# Patient Record
Sex: Female | Born: 1970 | Race: White | Hispanic: No | Marital: Single | Smoking: Current every day smoker
Health system: Southern US, Community
[De-identification: ages and names within clinical notes are randomized; demographics above are authoritative.]

## PROBLEM LIST (undated history)

## (undated) DIAGNOSIS — M549 Dorsalgia, unspecified: Secondary | ICD-10-CM

## (undated) DIAGNOSIS — O09529 Supervision of elderly multigravida, unspecified trimester: Secondary | ICD-10-CM

## (undated) HISTORY — PX: OTHER SURGICAL HISTORY: SHX169

## (undated) HISTORY — DX: Supervision of elderly multigravida, unspecified trimester: O09.529

---

## 2001-11-16 ENCOUNTER — Inpatient Hospital Stay (HOSPITAL_COMMUNITY): Admission: AD | Admit: 2001-11-16 | Discharge: 2001-11-18 | Payer: Self-pay | Admitting: Family Medicine

## 2001-11-16 ENCOUNTER — Encounter: Payer: Self-pay | Admitting: Family Medicine

## 2002-01-01 ENCOUNTER — Encounter: Payer: Self-pay | Admitting: Obstetrics and Gynecology

## 2002-01-01 ENCOUNTER — Ambulatory Visit (HOSPITAL_COMMUNITY): Admission: RE | Admit: 2002-01-01 | Discharge: 2002-01-01 | Payer: Self-pay | Admitting: Obstetrics and Gynecology

## 2002-06-21 ENCOUNTER — Inpatient Hospital Stay (HOSPITAL_COMMUNITY): Admission: AD | Admit: 2002-06-21 | Discharge: 2002-06-23 | Payer: Self-pay | Admitting: Obstetrics and Gynecology

## 2004-07-07 ENCOUNTER — Inpatient Hospital Stay (HOSPITAL_COMMUNITY): Admission: AD | Admit: 2004-07-07 | Discharge: 2004-07-10 | Payer: Self-pay | Admitting: Obstetrics and Gynecology

## 2006-06-13 ENCOUNTER — Emergency Department (HOSPITAL_COMMUNITY): Admission: EM | Admit: 2006-06-13 | Discharge: 2006-06-13 | Payer: Self-pay | Admitting: Emergency Medicine

## 2007-04-28 ENCOUNTER — Emergency Department (HOSPITAL_COMMUNITY): Admission: EM | Admit: 2007-04-28 | Discharge: 2007-04-28 | Payer: Self-pay | Admitting: Emergency Medicine

## 2009-10-29 ENCOUNTER — Emergency Department (HOSPITAL_COMMUNITY): Admission: EM | Admit: 2009-10-29 | Discharge: 2009-10-29 | Payer: Self-pay | Admitting: Emergency Medicine

## 2010-01-28 ENCOUNTER — Ambulatory Visit (HOSPITAL_COMMUNITY): Admission: RE | Admit: 2010-01-28 | Discharge: 2010-01-28 | Payer: Self-pay | Admitting: Obstetrics & Gynecology

## 2010-09-12 ENCOUNTER — Emergency Department (HOSPITAL_COMMUNITY)
Admission: EM | Admit: 2010-09-12 | Discharge: 2010-09-12 | Payer: Self-pay | Source: Home / Self Care | Admitting: Emergency Medicine

## 2010-11-09 LAB — CBC
HCT: 30.4 % — ABNORMAL LOW (ref 36.0–46.0)
Hemoglobin: 10.1 g/dL — ABNORMAL LOW (ref 12.0–15.0)
MCHC: 33 g/dL (ref 30.0–36.0)
MCV: 85.3 fL (ref 78.0–100.0)
RBC: 3.56 MIL/uL — ABNORMAL LOW (ref 3.87–5.11)
RDW: 16.3 % — ABNORMAL HIGH (ref 11.5–15.5)

## 2010-11-09 LAB — TYPE AND SCREEN: ABO/RH(D): A POS

## 2010-11-09 LAB — COMPREHENSIVE METABOLIC PANEL
ALT: 9 U/L (ref 0–35)
Alkaline Phosphatase: 40 U/L (ref 39–117)
BUN: 11 mg/dL (ref 6–23)
CO2: 25 mEq/L (ref 19–32)
Calcium: 9 mg/dL (ref 8.4–10.5)
GFR calc non Af Amer: >60 mL/min (ref >60)
Glucose, Bld: 73 mg/dL (ref 70–99)
Sodium: 137 mEq/L (ref 135–145)
Total Protein: 6.2 g/dL (ref 6.0–8.3)

## 2010-11-09 LAB — HCG, QUANTITATIVE, PREGNANCY: hCG, Beta Chain, Quant, S: <2 m[IU]/mL (ref <5)

## 2010-11-09 LAB — MRSA CULTURE

## 2011-01-08 NOTE — H&P (Signed)
[facility]  Patient:    Melissa [name], Melissa [name] Visit Number: [id number] MRN: [id number]          Service Type: OBS Location: 4A A425 01 Attending Physician:  Lilyan Punt Dictated by:   Lilyan Punt, M.D. Admit Date:  11/16/2001                           History and Physical  CHIEF COMPLAINT:  Nausea, vomiting, dizziness.  HISTORY OF PRESENT ILLNESS:  This is a 40 year old white female who presents to the office on November 16, 2001, with intense nausea along with multiple episodes of vomiting, unable to keep anything down.  Denies any diarrhea, denies any fever.  Relates slight chills.  Denies dysuria or urinary frequency.  Denies head congestion, sore throat, shortness of breath.  Does relate dizziness when she stands up, and feels very weak.  PAST MEDICAL HISTORY:  The patient has one child who is 68 years old.  She has never been hospitalized, other than childbirth.  Does not have any chronic health problems.  SOCIAL HISTORY:  Smokes one-half pack per day.  Drinks occasionally.  ALLERGIES:  BEES.  MEDICATIONS:  None.  FAMILY HISTORY:  Heart disease.  REVIEW OF SYSTEMS:  Has been seen in the past for migraines and has been treated with Wigraine in the past and also Vicodin.  She had been on Celexa and verapamil, but it is uncertain if she is taking that currently.  PHYSICAL EXAMINATION:  GENERAL:  Looks to feel ill but is alert, does not appear toxic.  HEENT:  TMs normal.  Throat normal.  MM moist.  Lips are dry.  NECK:  Supple.  No masses.  CHEST:  CTA.  HEART:  Rate 90, regular.  No murmurs.  ABDOMEN:  Soft.  No masses.  No tenderness.  EXTREMITIES:  No edema.  SKIN:  Turgor good.  VITAL SIGNS:  Blood pressure lying down 100/70, heart rate 90; sitting 96/72, heart rate 98; standing 80/60, heart rate 110.  LABORATORY DATA:  UA 3+ ketones.  Urine pregnancy positive.  ASSESSMENT AND PLAN: 1. Hyperemesis gravidarum versus  gastroenteritis:  Will admit into the    hospital.  Give IV fluids, and give small amounts of Phenergan as needed    for nausea.  Will consult Dr. Turner Daniels to see the patient. 2. Mild dehydration:  IV fluids as per above.  Monitor laboratories. 3. History of migraines:  Not an issue currently. Dictated by:   Lilyan Punt, M.D. Attending Physician:  Lilyan Punt DD:  11/17/01 TD:  11/17/01 Job: [id number]

## 2011-01-08 NOTE — H&P (Signed)
   NAME:  Melissa, [name] NO.:  [id number]   MEDICAL RECORD NO.:  [id number]                  PATIENT TYPE:   LOCATION:                                       FACILITY:   PHYSICIAN:  Jacklyn Shell, CNM       DATE OF BIRTH:   DATE OF ADMISSION:  06/21/2002  DATE OF DISCHARGE:                                HISTORY & PHYSICAL   CHIEF COMPLAINT:  Induction of labor due to cervical favorability and  history of rapid labors.   HISTORY OF PRESENT ILLNESS:  The patient is a 40 year old gravida 2, para 1  with an EDC of June 25, 2002 based on a last menstrual period.  First and  second trimester ultrasounds place her EDC anywhere between October 27 and  October 28, placing her at approximately 39-1/2 weeks to [redacted] weeks pregnant.  She has had regular prenatal care with a total weight gain of 35 pounds with  appropriate fundal height growth.  Blood pressures are 80s-110s/50s-80s.   PRENATAL LABORATORY DATA:  Blood type A+, rubella immune.  HBsAg, HIV, RPR,  gonorrhea/Chlamydia, MSAFP, and GBS are all negative.  One-hour GTT was  normal at 105.  She did have a first trimester positive drug screen for  marijuana with a negative one at 27 weeks.   PHYSICAL EXAMINATION:  HEENT:  Bad dentition.  HEART:  Regular rate and rhythm.  LUNGS:  Clear.  ABDOMEN:  Soft, nontender with a 37-cm fundal height.  PELVIC:  Cervix is 3 cm, 50% effaced, at 0 station.  Legs are negative.   IMPRESSION:  Intrauterine pregnancy at 39-1/2 to [redacted] weeks pregnant.  Induction of labor due to cervical favorability, pelvic pressure, and  history of rapid labor.   PLAN:  The patient to be admitted on October 30 at 7 a.m.  Plan artifical  rupture of membranes with probable Pitocin.                                               Jacklyn Shell, CNM    FC/MEDQ  D:  06/19/2002  T:  06/20/2002  Job:  [id number]   cc:   [facility]

## 2011-01-08 NOTE — Op Note (Signed)
NAME:  ARNEDA, SAPPINGTON NO.:  [id number]   MEDICAL RECORD NO.:  [id number]          PATIENT TYPE:  INP   LOCATION:  LDR2                          FACILITY:  [facility]   PHYSICIAN:  Tilda Burrow, M.D. DATE OF BIRTH:  04-30-1971   DATE OF PROCEDURE:  DATE OF DISCHARGE:                                 OPERATIVE REPORT   DELIVERY SUMMARY:  Onset of labor July 08, 2004.  Date of delivery,  July 08, 2004.  Time of onset of labor is 9 o'clock.  Length of time of  delivery is 10:22.  Length of first-stage labor is 1 hour and 22 minutes.  The length of second-stage labor is 0 minutes.  Third stage of labor, 8  minutes.   DELIVERY NOTE:  Nidia had a controlled, precipitous delivery via nursing  staff over an intact perineum.  Upon delivery, the infant was thoroughly  suctioned and dried.  Apgars were 9 and 9.  On arrival, perineum was noted  to have a very-small first-degree perineal laceration that required no  stitched.  Good hemostasis was noted.  Third stage of labor was actively  managed with 20 units of Pitocin and 1000 cc of lactated Ringer's at a rapid  rate.  The placenta was delivered spontaneously, very shortly __________.  Estimated blood loss as approximately 300 cc.  There was a three-vessel cord  noted upon inspection.  Mother and infant were stabilized in good condition  and transferred out to the postpartum unit.     Darl   DL/MEDQ  D:  16/05/9603  T:  07/08/2004  Job:  [id number]   cc:   [facility]

## 2011-01-08 NOTE — H&P (Signed)
NAME:  Melissa [name], Melissa [name] NO.:  [id number]   MEDICAL RECORD NO.:  [id number]          PATIENT TYPE:  INP   LOCATION:  LDR2                          FACILITY:  [facility]   PHYSICIAN:  Tilda Burrow, M.D. DATE OF BIRTH:  29-Jun-1971   DATE OF ADMISSION:  07/07/2004  DATE OF DISCHARGE:  LH                                HISTORY & PHYSICAL   ADMITTING DIAGNOSIS:  Pregnancy at 39.5 weeks for induction of labor for  post dates.   HISTORY OF PRESENT ILLNESS:  Melissa [name] is a 40 year old, gravida 3, para 2,  due on June 27, 2004, which is consensual with an early ultrasound.   MEDICAL HISTORY:  1.  Tendinitis.  2.  Headaches.   SURGICAL HISTORY:  Negative.   ALLERGIES:  No known allergies.   SOCIAL HISTORY:  She is single.  She lives with the father of the baby, and  he is present and supportive.   PHYSICAL EXAMINATION:  VITAL SIGNS:  Weight is 136, blood pressure 194/68.  ABDOMEN:  Fundal height was 36 cm.  PELVIC:  Cervix was 1 cm, 50%, soft, mid position.   PLAN:  We are going to admit for balloon dilation and pitocin induction of  labor, and we are going to treat her for positive GBS status with ampicillin  2 gm IV and 1 gm q.4h.     Darl   DL/MEDQ  D:  40/98/1191  T:  07/07/2004  Job:  [id number]   cc:   [facility]

## 2011-01-08 NOTE — Discharge Summary (Signed)
[facility]  Patient:    Melissa [name], Melissa [name] Visit Number: [id number] MRN: [id number]          Service Type: OBS Location: 4A A425 01 Attending Physician:  Lazaro Arms Dictated by:   Duane Lope, M.D. Admit Date:  11/16/2001 Discharge Date: 11/18/2001                             Discharge Summary  DISCHARGE DIAGNOSES: 1. Intrauterine pregnancy at nine weeks gestation. 2. Hyperemesis gravidarum with electrolyte disturbance and dehydration.  PROCEDURES:  November 16, 2001, hospital consult.  November 17, 2001, daily care. November 18, 2001, discharge.  HISTORY OF PRESENT ILLNESS:  Please refer to the admission information provided by Dr. Lilyan Punt and also my consult note which was done shortly after the patient was admitted.  HOSPITAL COURSE:  The patient was placed in the hospital.  Underwent IV hydration and nausea control.  She responded quickly.  Was taking clear liquids by the second hospital day and progressed to full liquids and a low-residue, low-fiber diet.  By hospital day #3 she had had no nausea or vomiting, was voiding well, was ambulating without difficulty, and was ready for discharge.  She was discharged to home on Phenergan tablets, and to follow up in our office next week for her first prenatal visit. Dictated by:   Duane Lope, M.D. Attending Physician:  Lazaro Arms DD:  11/18/01 TD:  11/18/01 Job: [id number]

## 2011-01-08 NOTE — Op Note (Signed)
   NAME:  Melissa [name], Melissa [name]                     ACCOUNT NO.:  [id number]   MEDICAL RECORD NO.:  [id number]                   PATIENT TYPE:  INP   LOCATION:  A419                                 FACILITY:  [facility]   PHYSICIAN:  Tilda Burrow, M.D.              DATE OF BIRTH:  11-13-1970   DATE OF PROCEDURE:  DATE OF DISCHARGE:                                 OPERATIVE REPORT   DELIVERY NOTE:  The patient was noted to be 9-10 cm dilated, 0 station at  approximately 1335, with a strong urge to push.  She was allowed to push and  the cervix was easily reduced.  After about a 20-minute, second stage, the  patient delivered a viable female infant at 1400.  The mouth and nose were  suctioned on the perineum; and the body was delivered without difficulty.  Apgars are 8 and 9.  Weight 7 pounds 13 ounces.  Twenty units of Pitocin  diluted in 1000 cc of lactated Ringers was then infused rapidly IV.   The placenta separated spontaneously and was delivered by a controlled cord  traction and maternal pushing effort at 1405.  It was inspected and appears  to be intact with a 3-vessel cord.  The vagina was inspected and a small  first-degree perineal laceration was noted.  After infiltrating it with 5 cc  of 1% Xylocaine it was repaired with 1 stitch of 2-0 Vicryl.  Estimated  blood loss 300 cc.     Jacklyn Shell, CNM             Tilda Burrow, M.D.    FC/MEDQ  D:  06/21/2002  T:  06/21/2002  Job:  [id number]   cc:   [facility]

## 2011-01-08 NOTE — Consult Note (Signed)
[facility]  Patient:    Melissa [name], Melissa [name] Visit Number: [id number] MRN: [id number]          Service Type: OBS Location: 4A A425 01 Attending Physician:  Lilyan Punt Dictated by:   Duane Lope, M.D. Proc. Date: 11/16/01 Admit Date:  11/16/2001                            Consultation Report  BRIEF HISTORY:  The patient is a 40 year old white female gravida 2, para 1, abortus 0, with a estimated gestational age by last menstrual period of approximately 9 weeks who was seen by Dr. Gerda Diss in the office today complaining of excessive nausea and vomiting, large ketones, and orthostatic changes.  The patient had a sonogram at the time of admission that reveals a 9 week 1 day viable embryo with positive cardiac activity with a rate of 165. The patient states she does not have excessive saliva production.  She does have heartburn and basically has been getting sick every day for about 1 month.  She is rather small chronically but denies a history of eating disorder.  PAST MEDICAL HISTORY:  Migraines.  PAST SURGICAL HISTORY:  Negative.  PAST OB HISTORY:  1 vaginal deliver 9 years ago.  ALLERGIES:  None.  MEDICATIONS:  None.  REVIEW OF SYSTEMS:  As per the HPI otherwise negative.  SOCIAL HISTORY:  She smokes 1/2 a pack to 1 pack of cigarettes a day.  No drugs, no alcohol.  PHYSICAL EXAMINATION:  ABDOMEN:  On targeted examination abdomen is benign.  No hepatosplenomegaly or masses, and is nontender.  EXTREMITIES:  Show poor skin turgor and no edema.  IMPRESSION: 1. Intrauterine pregnancy at 9 weeks and 1 day gestation. 2. Hyperemesis gravidarum. 3. Dehydration with hyponatremia on Met-7.  PLAN:  The patient is admitted for IV hydration, nausea control and will be transferred to our service for daily care starting November 17, 2001. Dictated by:   Duane Lope, M.D. Attending Physician:  Lilyan Punt DD:  11/16/01 TD:  11/17/01 Job:  [id number]
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
## 2011-08-17 ENCOUNTER — Encounter: Payer: Self-pay | Admitting: *Deleted

## 2011-08-17 ENCOUNTER — Emergency Department (HOSPITAL_COMMUNITY)
Admission: EM | Admit: 2011-08-17 | Discharge: 2011-08-17 | Disposition: A | Discharge status: Home / Self Care | Payer: Self-pay | Attending: Emergency Medicine | Admitting: Emergency Medicine

## 2011-08-17 DIAGNOSIS — G43909 Migraine, unspecified, not intractable, without status migrainosus: Secondary | ICD-10-CM | POA: Insufficient documentation

## 2011-08-17 DIAGNOSIS — R404 Transient alteration of awareness: Secondary | ICD-10-CM | POA: Insufficient documentation

## 2011-08-17 DIAGNOSIS — H53149 Visual discomfort, unspecified: Secondary | ICD-10-CM | POA: Insufficient documentation

## 2011-08-17 DIAGNOSIS — R110 Nausea: Secondary | ICD-10-CM | POA: Insufficient documentation

## 2011-08-17 DIAGNOSIS — R5381 Other malaise: Secondary | ICD-10-CM | POA: Insufficient documentation

## 2011-08-17 DIAGNOSIS — R5383 Other fatigue: Secondary | ICD-10-CM | POA: Insufficient documentation

## 2011-08-17 MED ORDER — METOCLOPRAMIDE HCL 5 MG/ML IJ SOLN
10.0000 mg | Freq: Once | INTRAMUSCULAR | Status: AC
  Administered 2011-08-17: 10 mg via INTRAMUSCULAR
  Filled 2011-08-17 (×2): qty 2

## 2011-08-17 MED ORDER — KETOROLAC TROMETHAMINE 60 MG/2ML IM SOLN
30.0000 mg | Freq: Once | INTRAMUSCULAR | Status: AC
  Administered 2011-08-17: 30 mg via INTRAMUSCULAR
  Filled 2011-08-17: qty 2

## 2011-08-17 MED ORDER — DIPHENHYDRAMINE HCL 25 MG PO CAPS
25.0000 mg | ORAL_CAPSULE | Freq: Once | ORAL | Status: AC
  Administered 2011-08-17: 25 mg via ORAL
  Filled 2011-08-17 (×2): qty 1

## 2011-08-17 NOTE — ED Notes (Signed)
Pt's speech is slurred and her pupil are pinpoint.

## 2011-08-17 NOTE — ED Notes (Signed)
Pt asleep upon RN entering the room.

## 2011-08-17 NOTE — Discharge Instructions (Signed)
Migraine Headache A migraine headache is an intense, throbbing pain on one or both sides of your head. The exact cause of a migraine headache is not always known. A migraine may be caused when nerves in the brain become irritated and release chemicals that cause swelling within blood vessels, causing pain. Many migraine sufferers have a family history of migraines. Before you get a migraine you may or may not get an aura. An aura is a group of symptoms that can predict the beginning of a migraine. An aura may include:  Visual changes such as:   Flashing lights.   Bright spots or zig-zag lines.   Tunnel vision.   Feelings of numbness.   Trouble talking.   Muscle weakness.  SYMPTOMS  Pain on one or both sides of your head.   Pain that is pulsating or throbbing in nature.   Pain that is severe enough to prevent daily activities.   Pain that is aggravated by any daily physical activity.   Nausea (feeling sick to your stomach), vomiting, or both.   Pain with exposure to bright lights, loud noises, or activity.   General sensitivity to bright lights or loud noises.  MIGRAINE TRIGGERS Examples of triggers of migraine headaches include:   Alcohol.   Smoking.   Stress.   It may be related to menses (female menstruation).   Aged cheeses.   Foods or drinks that contain nitrates, glutamate, aspartame, or tyramine.   Lack of sleep.   Chocolate.   Caffeine.   Hunger.   Medications such as nitroglycerine (used to treat chest pain), birth control pills, estrogen, and some blood pressure medications.  DIAGNOSIS  A migraine headache is often diagnosed based on:  Symptoms.   Physical examination.   A computerized X-ray scan (computed tomography, CT) of your head.  TREATMENT  Medications can help prevent migraines if they are recurrent or should they become recurrent. Your caregiver can help you with a medication or treatment program that will be helpful to you.   Lying  down in a dark, quiet room may be helpful.   Keeping a headache diary may help you find a trend as to what may be triggering your headaches.  SEEK IMMEDIATE MEDICAL CARE IF:   You have confusion, personality changes or seizures.   You have headaches that wake you from sleep.   You have an increased frequency in your headaches.   You have a stiff neck.   You have a loss of vision.   You have muscle weakness.   You start losing your balance or have trouble walking.   You feel faint or pass out.  MAKE SURE YOU:   Understand these instructions.   Will watch your condition.   Will get help right away if you are not doing well or get worse.  Document Released: 08/09/2005 Document Revised: 04/21/2011 Document Reviewed: 03/25/2009 [employer] Patient Information 2012 [employer]. 

## 2011-08-17 NOTE — ED Notes (Signed)
Pt states migraine x 3 days. Denies vomiting today. States she took a vicodin earlier for pain. Pt states migraine is different this time due to blurred vision lasting 30 sec at a time and ringing in the ears. NAD at this time.

## 2011-08-17 NOTE — ED Provider Notes (Signed)
History     CSN: [id number]  Arrival date & time 08/17/11  1431   First MD Initiated Contact with Patient 08/17/11 1532      Chief Complaint  Patient presents with  . Migraine    (Consider location/radiation/quality/duration/timing/severity/associated sxs/prior treatment) HPI Comments: Pt reports similar HA to prior migraines, but has been present for 3 days.  Pt is asleep when I come into room. She seems slow speech and drowsy, she attributes to being tired, and didn't sleep well last night due to HA.  She took someone else's Flexeril 2 days ago and has not had any more, didn't help her HA.  She has taken OTC meds without relief.  She reports normally she can sleep and feels improved.  No trauma, no fever.  She has nausea and photophobia like typical migraines.  Was gradual in onset.  No facial weakness, numbness or weakness in arms or legs.    The history is provided by the patient.    Past Medical History  Diagnosis Date  . Migraine     Past Surgical History  Procedure Date  . Abdominal lesion     No family history on file.  History  Substance Use Topics  . Smoking status: Current Everyday Smoker  . Smokeless tobacco: Not on file  . Alcohol Use: Yes     OCc    OB History    Grav Para Term Preterm Abortions TAB SAB Ect Mult Living                  Review of Systems  Constitutional: Negative for fever, chills and appetite change.  HENT: Negative for rhinorrhea, neck pain, neck stiffness, postnasal drip and sinus pressure.   Eyes: Positive for photophobia. Negative for visual disturbance.  Respiratory: Negative for shortness of breath.   Gastrointestinal: Positive for nausea. Negative for vomiting.  Skin: Negative for rash.  Neurological: Positive for headaches. Negative for syncope and weakness.    Allergies  Bee  Home Medications  No current outpatient prescriptions on file.  BP 126/76  Pulse 86  Temp(Src) 97.7 F (36.5 C) (Oral)  Resp 20  Ht 5'  (1.524 m)  Wt 100 lb (45.36 kg)  BMI 19.53 kg/m2  SpO2 100%  LMP 08/02/2011  Physical Exam  Nursing note and vitals reviewed. Constitutional: She is oriented to person, place, and time. She appears well-developed and well-nourished. She appears listless.  HENT:  Head: Normocephalic and atraumatic.  Eyes: Conjunctivae and EOM are normal. Pupils are equal, round, and reactive to light. No scleral icterus.  Neck: Normal range of motion. Neck supple.  Pulmonary/Chest: Effort normal. She has no wheezes.  Abdominal: Soft.  Neurological: She is oriented to person, place, and time. She has normal strength. She appears listless. No cranial nerve deficit. Coordination normal. GCS eye subscore is 4. GCS verbal subscore is 5. GCS motor subscore is 6.  Skin: Skin is warm and dry.  Psychiatric: She is slowed.    ED Course  Procedures (including critical care time)  Labs Reviewed - No data to display No results found.   1. Migraine       MDM  Pt is somewhat drowsy already.  She denies any other medications other than OTC and the flexeril 2 days ago.  otherwise she is purposeful and is oriented times 3.  Will give IM toradol, reglan and oral benadryl and will reassess.  I think pt can then rest at home and would be safe for outpt  therapy.  Despite somewhat drowsiness I do not think this is altered mental status requiring head CT or MRI as she is not focally weak or numb, not confused, just merely drowsy and sleepy.        5:45 PM Pt rechecked.  Pt is sleeping, but rouses easily.  Somnolent, but sensical, reports HA improved, wants to go home and will continue sleeping in her own bed.  Purposeful, vital stable.    Gavin Pound. Oletta Lamas, MD 08/17/11 1745

## 2011-08-24 NOTE — L&D Delivery Note (Signed)
Delivery Note At 5:28 PM a viable female was delivered via Vaginal, Spontaneous Delivery (Presentation:LOA ;  ).  APGAR: 9, 9; weight  Pending,. Placenta status: Intact, Spontaneous.  Cord: 3 vessels with the following complications: None.   Anesthesia:  none Episiotomy: none Lacerations: none Suture Repair: none Est. Blood Loss (mL): 200   Mom to postpartum.  Baby to nursery-stable.  CRESENZO-DISHMAN,Pari Lombard 04/24/2012, 5:40 PM

## 2011-12-30 ENCOUNTER — Other Ambulatory Visit: Payer: Self-pay | Admitting: Adult Health

## 2011-12-30 ENCOUNTER — Other Ambulatory Visit (HOSPITAL_COMMUNITY)
Admission: RE | Admit: 2011-12-30 | Discharge: 2011-12-30 | Disposition: A | Discharge status: Home / Self Care | Payer: Medicaid Other | Source: Ambulatory Visit | Attending: Obstetrics and Gynecology | Admitting: Obstetrics and Gynecology

## 2011-12-30 DIAGNOSIS — Z113 Encounter for screening for infections with a predominantly sexual mode of transmission: Secondary | ICD-10-CM | POA: Insufficient documentation

## 2011-12-30 DIAGNOSIS — Z01419 Encounter for gynecological examination (general) (routine) without abnormal findings: Secondary | ICD-10-CM | POA: Insufficient documentation

## 2011-12-30 DIAGNOSIS — R87810 Cervical high risk human papillomavirus (HPV) DNA test positive: Secondary | ICD-10-CM | POA: Insufficient documentation

## 2011-12-30 LAB — OB RESULTS CONSOLE GC/CHLAMYDIA
Chlamydia: NEGATIVE
Gonorrhea: NEGATIVE

## 2011-12-30 LAB — OB RESULTS CONSOLE HIV ANTIBODY (ROUTINE TESTING): HIV: NONREACTIVE

## 2011-12-30 LAB — OB RESULTS CONSOLE ABO/RH

## 2012-04-10 LAB — OB RESULTS CONSOLE GBS: GBS: POSITIVE

## 2012-04-20 ENCOUNTER — Telehealth (HOSPITAL_COMMUNITY): Payer: Self-pay | Admitting: *Deleted

## 2012-04-20 ENCOUNTER — Encounter (HOSPITAL_COMMUNITY): Payer: Self-pay | Admitting: *Deleted

## 2012-04-20 NOTE — Telephone Encounter (Signed)
Preadmission screen  

## 2012-04-24 ENCOUNTER — Encounter (HOSPITAL_COMMUNITY): Payer: Self-pay

## 2012-04-24 ENCOUNTER — Inpatient Hospital Stay (HOSPITAL_COMMUNITY)
Admission: RE | Admit: 2012-04-24 | Discharge: 2012-04-26 | DRG: 767 | Disposition: A | Discharge status: Home / Self Care | Payer: Medicaid Other | Source: Ambulatory Visit | Attending: Obstetrics & Gynecology | Admitting: Obstetrics & Gynecology

## 2012-04-24 DIAGNOSIS — O09529 Supervision of elderly multigravida, unspecified trimester: Secondary | ICD-10-CM | POA: Diagnosis present

## 2012-04-24 DIAGNOSIS — O9989 Other specified diseases and conditions complicating pregnancy, childbirth and the puerperium: Secondary | ICD-10-CM

## 2012-04-24 DIAGNOSIS — Z302 Encounter for sterilization: Secondary | ICD-10-CM

## 2012-04-24 DIAGNOSIS — O99892 Other specified diseases and conditions complicating childbirth: Secondary | ICD-10-CM

## 2012-04-24 DIAGNOSIS — O365990 Maternal care for other known or suspected poor fetal growth, unspecified trimester, not applicable or unspecified: Secondary | ICD-10-CM

## 2012-04-24 DIAGNOSIS — Z9851 Tubal ligation status: Secondary | ICD-10-CM

## 2012-04-24 DIAGNOSIS — Z22330 Carrier of Group B streptococcus: Secondary | ICD-10-CM

## 2012-04-24 LAB — MRSA PCR SCREENING: MRSA by PCR: NEGATIVE

## 2012-04-24 LAB — CBC
HCT: 33.6 % — ABNORMAL LOW (ref 36.0–46.0)
Hemoglobin: 11.5 g/dL — ABNORMAL LOW (ref 12.0–15.0)
RBC: 3.58 MIL/uL — ABNORMAL LOW (ref 3.87–5.11)
RDW: 13.5 % (ref 11.5–15.5)
WBC: 12.1 10*3/uL — ABNORMAL HIGH (ref 4.0–10.5)

## 2012-04-24 MED ORDER — FENTANYL CITRATE 0.05 MG/ML IJ SOLN
50.0000 ug | INTRAMUSCULAR | Status: DC | PRN
  Administered 2012-04-24: 50 ug via INTRAVENOUS
  Filled 2012-04-24: qty 2

## 2012-04-24 MED ORDER — HYDROXYZINE HCL 50 MG/ML IM SOLN: 50.0000 mg | Freq: Four times a day (QID) | INTRAMUSCULAR | Status: DC | PRN

## 2012-04-24 MED ORDER — DIBUCAINE 1 % RE OINT: 1.0000 "application " | TOPICAL_OINTMENT | RECTAL | Status: DC | PRN

## 2012-04-24 MED ORDER — SENNOSIDES-DOCUSATE SODIUM 8.6-50 MG PO TABS
2.0000 | ORAL_TABLET | Freq: Every day | ORAL | Status: DC
  Administered 2012-04-24 – 2012-04-25 (×2): 2 via ORAL

## 2012-04-24 MED ORDER — TETANUS-DIPHTH-ACELL PERTUSSIS 5-2.5-18.5 LF-MCG/0.5 IM SUSP
0.5000 mL | Freq: Once | INTRAMUSCULAR | Status: DC
  Filled 2012-04-24: qty 0.5

## 2012-04-24 MED ORDER — ONDANSETRON HCL 4 MG PO TABS: 4.0000 mg | ORAL_TABLET | ORAL | Status: DC | PRN

## 2012-04-24 MED ORDER — OXYTOCIN 40 UNITS IN LACTATED RINGERS INFUSION - SIMPLE MED
1.0000 m[IU]/min | INTRAVENOUS | Status: DC
  Administered 2012-04-24: 2 m[IU]/min via INTRAVENOUS

## 2012-04-24 MED ORDER — LACTATED RINGERS IV SOLN
INTRAVENOUS | Status: DC
  Administered 2012-04-24 (×2): via INTRAVENOUS

## 2012-04-24 MED ORDER — OXYCODONE-ACETAMINOPHEN 5-325 MG PO TABS
1.0000 | ORAL_TABLET | ORAL | Status: DC | PRN
  Administered 2012-04-24: 2 via ORAL
  Administered 2012-04-25: 1 via ORAL
  Administered 2012-04-25 – 2012-04-26 (×6): 2 via ORAL
  Filled 2012-04-24 (×3): qty 2
  Filled 2012-04-24: qty 1
  Filled 2012-04-24 (×3): qty 2
  Filled 2012-04-24 (×2): qty 1
  Filled 2012-04-24: qty 2

## 2012-04-24 MED ORDER — LACTATED RINGERS IV SOLN
500.0000 mL | INTRAVENOUS | Status: DC | PRN
  Administered 2012-04-24: 500 mL via INTRAVENOUS

## 2012-04-24 MED ORDER — DIPHENHYDRAMINE HCL 25 MG PO CAPS: 25.0000 mg | ORAL_CAPSULE | Freq: Four times a day (QID) | ORAL | Status: DC | PRN

## 2012-04-24 MED ORDER — METHYLERGONOVINE MALEATE 0.2 MG PO TABS: 0.2000 mg | ORAL_TABLET | ORAL | Status: DC | PRN

## 2012-04-24 MED ORDER — BENZOCAINE-MENTHOL 20-0.5 % EX AERO
1.0000 "application " | INHALATION_SPRAY | CUTANEOUS | Status: DC | PRN
  Filled 2012-04-24: qty 56

## 2012-04-24 MED ORDER — FENTANYL CITRATE 0.05 MG/ML IJ SOLN: 50.0000 ug | INTRAMUSCULAR | Status: DC | PRN

## 2012-04-24 MED ORDER — METOCLOPRAMIDE HCL 10 MG PO TABS
10.0000 mg | ORAL_TABLET | Freq: Once | ORAL | Status: AC
  Administered 2012-04-25: 10 mg via ORAL
  Filled 2012-04-24: qty 1

## 2012-04-24 MED ORDER — FAMOTIDINE 20 MG PO TABS
40.0000 mg | ORAL_TABLET | Freq: Once | ORAL | Status: AC
  Administered 2012-04-25: 40 mg via ORAL
  Filled 2012-04-24: qty 1

## 2012-04-24 MED ORDER — ONDANSETRON HCL 4 MG/2ML IJ SOLN: 4.0000 mg | Freq: Four times a day (QID) | INTRAMUSCULAR | Status: DC | PRN

## 2012-04-24 MED ORDER — FLEET ENEMA 7-19 GM/118ML RE ENEM: 1.0000 | ENEMA | RECTAL | Status: DC | PRN

## 2012-04-24 MED ORDER — PENICILLIN G POTASSIUM 5000000 UNITS IJ SOLR
5.0000 10*6.[IU] | Freq: Once | INTRAVENOUS | Status: AC
  Administered 2012-04-24: 5 10*6.[IU] via INTRAVENOUS
  Filled 2012-04-24: qty 5

## 2012-04-24 MED ORDER — MAGNESIUM HYDROXIDE 400 MG/5ML PO SUSP: 30.0000 mL | ORAL | Status: DC | PRN

## 2012-04-24 MED ORDER — SIMETHICONE 80 MG PO CHEW: 80.0000 mg | CHEWABLE_TABLET | ORAL | Status: DC | PRN

## 2012-04-24 MED ORDER — ZOLPIDEM TARTRATE 5 MG PO TABS: 5.0000 mg | ORAL_TABLET | Freq: Every evening | ORAL | Status: DC | PRN

## 2012-04-24 MED ORDER — ACETAMINOPHEN 325 MG PO TABS: 650.0000 mg | ORAL_TABLET | ORAL | Status: DC | PRN

## 2012-04-24 MED ORDER — ONDANSETRON HCL 4 MG/2ML IJ SOLN: 4.0000 mg | INTRAMUSCULAR | Status: DC | PRN

## 2012-04-24 MED ORDER — CITRIC ACID-SODIUM CITRATE 334-500 MG/5ML PO SOLN: 30.0000 mL | ORAL | Status: DC | PRN

## 2012-04-24 MED ORDER — OXYCODONE-ACETAMINOPHEN 5-325 MG PO TABS
1.0000 | ORAL_TABLET | ORAL | Status: DC | PRN
  Administered 2012-04-24: 1 via ORAL
  Filled 2012-04-24: qty 1

## 2012-04-24 MED ORDER — LIDOCAINE HCL (PF) 1 % IJ SOLN: 30.0000 mL | INTRAMUSCULAR | Status: DC | PRN

## 2012-04-24 MED ORDER — OXYTOCIN BOLUS FROM INFUSION
250.0000 mL | Freq: Once | INTRAVENOUS | Status: AC
  Administered 2012-04-24: 250 mL via INTRAVENOUS
  Filled 2012-04-24: qty 500

## 2012-04-24 MED ORDER — HYDROXYZINE HCL 50 MG PO TABS: 50.0000 mg | ORAL_TABLET | Freq: Four times a day (QID) | ORAL | Status: DC | PRN

## 2012-04-24 MED ORDER — PRENATAL MULTIVITAMIN CH
1.0000 | ORAL_TABLET | Freq: Every day | ORAL | Status: DC
  Administered 2012-04-25: 1 via ORAL
  Filled 2012-04-24: qty 1

## 2012-04-24 MED ORDER — LACTATED RINGERS IV SOLN: INTRAVENOUS | Status: DC

## 2012-04-24 MED ORDER — LANOLIN HYDROUS EX OINT: TOPICAL_OINTMENT | CUTANEOUS | Status: DC | PRN

## 2012-04-24 MED ORDER — FERROUS SULFATE 325 (65 FE) MG PO TABS
325.0000 mg | ORAL_TABLET | Freq: Two times a day (BID) | ORAL | Status: DC
  Filled 2012-04-24: qty 1

## 2012-04-24 MED ORDER — TERBUTALINE SULFATE 1 MG/ML IJ SOLN: 0.2500 mg | Freq: Once | INTRAMUSCULAR | Status: DC | PRN

## 2012-04-24 MED ORDER — METHYLERGONOVINE MALEATE 0.2 MG/ML IJ SOLN: 0.2000 mg | INTRAMUSCULAR | Status: DC | PRN

## 2012-04-24 MED ORDER — MEASLES, MUMPS & RUBELLA VAC ~~LOC~~ INJ
0.5000 mL | INJECTION | Freq: Once | SUBCUTANEOUS | Status: DC
  Filled 2012-04-24: qty 0.5

## 2012-04-24 MED ORDER — OXYTOCIN 40 UNITS IN LACTATED RINGERS INFUSION - SIMPLE MED
62.5000 mL/h | Freq: Once | INTRAVENOUS | Status: AC
  Administered 2012-04-24: 62.5 mL/h via INTRAVENOUS
  Filled 2012-04-24: qty 1000

## 2012-04-24 MED ORDER — PENICILLIN G POTASSIUM 5000000 UNITS IJ SOLR
2.5000 10*6.[IU] | INTRAVENOUS | Status: DC
  Administered 2012-04-24: 2.5 10*6.[IU] via INTRAVENOUS
  Filled 2012-04-24 (×5): qty 2.5

## 2012-04-24 MED ORDER — WITCH HAZEL-GLYCERIN EX PADS: 1.0000 "application " | MEDICATED_PAD | CUTANEOUS | Status: DC | PRN

## 2012-04-24 NOTE — Progress Notes (Signed)
   Melissa [name] is a 41 y.o. [phone] at [redacted]w[redacted]d  admitted for induction of labor due to Poor fetal growth.  Subjective: Requests IV pain med   Objective: BP 112/88  Pulse 63  Temp 97.7 F (36.5 C) (Oral)  Resp 28  Ht 5' (1.524 m)  Wt 57.153 kg (126 lb)  BMI 24.61 kg/m2  LMP 08/02/2011    FHT:  FHR: 140 bpm, variability: moderate,  accelerations:  Present,  decelerations:  Absent UC:   regular, every 2-3 minutes SVE:   Dilation: 4 Effacement (%): 80 Station: -1 Exam by:: Melissa [name], CNM  Labs: Lab Results  Component Value Date   WBC 12.1* 04/24/2012   HGB 11.5* 04/24/2012   HCT 33.6* 04/24/2012   MCV 93.9 04/24/2012   PLT 239 04/24/2012  Pitocin @ 16 mu/min  Assessment / Plan: Induction of labor due to IUGR,  progressing well on pitocin  Labor: Progressing normally Fetal Wellbeing:  Category I Pain Control:  Fentanyl Anticipated MOD:  NSVD  CRESENZO-DISHMAN,Melissa [name] 04/24/2012, 4:52 PM

## 2012-04-24 NOTE — H&P (Signed)
  Melissa [name] is a 41 y.o. female [phone] with IUP at [redacted]w[redacted]d presenting for IOL for IUGR. Pt received PNC  care at [facility] since 22 wks  Prenatal History/Complications: Short Cervix:  On prometrium during pregnancy IUGR:  Normal dopplers  Past Medical History: Past Medical History  Diagnosis Date  . Migraine   . AMA (advanced maternal age) multigravida 35+     Past Surgical History: Past Surgical History  Procedure Date  . Abdominal lesion     Obstetrical History: OB History    Grav Para Term Preterm Abortions TAB SAB Ect Mult Living   4 3 3       3       Gynecological History: OB History    Grav Para Term Preterm Abortions TAB SAB Ect Mult Living   4 3 3       3       Social History: History   Social History  . Marital Status: Single    Spouse Name: N/A    Number of Children: N/A  . Years of Education: N/A   Social History Main Topics  . Smoking status: Current Everyday Smoker -- 0.2 packs/day  . Smokeless tobacco: Never Used  . Alcohol Use: Yes     OCc  . Drug Use: Yes    Special: Marijuana  . Sexually Active: Yes     pp tubal in hospital after delivery   Other Topics Concern  . None   Social History Narrative  . None    Family History: Family History  Problem Relation Age of Onset  . Heart attack Father   . Multiple sclerosis Maternal Uncle     Allergies: Allergies  Allergen Reactions  . Bee Venom Anaphylaxis  . Ibuprofen Nausea And Vomiting  . Nutritional Supplements Itching    Multi-vitamins    Prescriptions prior to admission  Medication Sig Dispense Refill  . acetaminophen-codeine (TYLENOL #3) 300-30 MG per tablet Take 1 tablet by mouth every 8 (eight) hours as needed. For migraines/back aches      . calcium carbonate (TUMS - DOSED IN MG ELEMENTAL CALCIUM) 500 MG chewable tablet Chew 4 tablets by mouth daily as needed. For heartburn        Review of Systems - Negative    Blood pressure 113/73, pulse 85, temperature  97.8 F (36.6 C), temperature source Oral, resp. rate 20, height 5' (1.524 m), weight 57.153 kg (126 lb), last menstrual period 08/02/2011. General appearance: alert, cooperative, appears older than stated age and no distress Lungs: clear to auscultation bilaterally Heart: regular rate and rhythm Abdomen: soft, non-tender; bowel sounds normal Extremities: Homans sign is negative, no sign of DVT DTR's 2+ Presentation: cephalic Fetal monitoringBaseline: 140 bpm, Variability: Good {> 6 bpm), Accelerations: Reactive and Decelerations: Absent Uterine activityNone Dilation: 2.5 Effacement (%): 80 Station: -1 Exam by:: Joyce Copa, CNM   Prenatal labs: ABO, Rh: A/Positive/-- (05/09 0000) Antibody:  immune Rubella:  immune RPR: Nonreactive (05/09 0000)  HBsAg: Negative (05/09 0000)  HIV: Non-reactive (05/09 0000)  GBS: Positive (08/19 0000)  1 hr Glucola 133 Genetic screening  Too late Anatomy US normal except IUGR (EFW ~ 5.5 lbs)  Assessment: Melissa [name] is a 41 y.o. [phone] with an IUP at [redacted]w[redacted]d presenting for IOL for IUGR  Plan: Pitocin   CRESENZO-DISHMAN,Orlan Aversa 04/24/2012, 9:04 AM

## 2012-04-24 NOTE — Progress Notes (Signed)
   Melissa [name] is a 41 y.o. [phone] at [redacted]w[redacted]d  admitted for induction of labor due to Poor fetal growth.  Subjective: Feeling contractions.  Doesn't want pain meds  Objective: BP 84/53  Pulse 77  Temp 98.2 F (36.8 C) (Oral)  Resp 20  Ht 5' (1.524 m)  Wt 57.153 kg (126 lb)  BMI 24.61 kg/m2  LMP 08/02/2011    FHT:  FHR: 140 bpm, variability: moderate,  accelerations:  Present,  decelerations:  Absent UC:   irregular, every 2-4 minutes SVE:   Dilation: 2.5 Effacement (%): 80 Station: -1 Exam by:: F. Cresenzo-Dishmon, CNM  No change  Labs: Lab Results  Component Value Date   WBC 12.1* 04/24/2012   HGB 11.5* 04/24/2012   HCT 33.6* 04/24/2012   MCV 93.9 04/24/2012   PLT 239 04/24/2012   Pitocin @ 6 mu/Min (was not increased for 2.5 hours) Assessment / Plan: IOL for IUGR, not in labor; increase Pitocin  Labor: no Fetal Wellbeing:  Category I Pain Control:  Labor support without medications Anticipated MOD:  NSVD  CRESENZO-DISHMAN,Nannie Starzyk 04/24/2012, 1:16 PM

## 2012-04-24 NOTE — H&P (Signed)
Agree with note ARNOLD,JAMES  

## 2012-04-25 ENCOUNTER — Inpatient Hospital Stay (HOSPITAL_COMMUNITY): Payer: Medicaid Other | Admitting: Anesthesiology

## 2012-04-25 ENCOUNTER — Encounter (HOSPITAL_COMMUNITY)
Admission: RE | Disposition: A | Discharge status: Home / Self Care | Payer: Self-pay | Source: Ambulatory Visit | Attending: Obstetrics & Gynecology

## 2012-04-25 ENCOUNTER — Encounter (HOSPITAL_COMMUNITY): Payer: Self-pay | Admitting: Anesthesiology

## 2012-04-25 DIAGNOSIS — Z302 Encounter for sterilization: Secondary | ICD-10-CM

## 2012-04-25 HISTORY — PX: TUBAL LIGATION: SHX77

## 2012-04-25 LAB — RAPID URINE DRUG SCREEN, HOSP PERFORMED
Cocaine: NOT DETECTED
Opiates: NOT DETECTED
Tetrahydrocannabinol: NOT DETECTED

## 2012-04-25 SURGERY — LIGATION, FALLOPIAN TUBE, POSTPARTUM
Anesthesia: Spinal | Site: Abdomen | Laterality: Bilateral | Wound class: Clean Contaminated

## 2012-04-25 MED ORDER — MIDAZOLAM HCL 2 MG/2ML IJ SOLN
INTRAMUSCULAR | Status: AC
  Filled 2012-04-25: qty 2

## 2012-04-25 MED ORDER — MEPERIDINE HCL 25 MG/ML IJ SOLN: 6.2500 mg | INTRAMUSCULAR | Status: DC | PRN

## 2012-04-25 MED ORDER — FENTANYL CITRATE 0.05 MG/ML IJ SOLN
INTRAMUSCULAR | Status: AC
  Filled 2012-04-25: qty 2

## 2012-04-25 MED ORDER — BUPIVACAINE HCL (PF) 0.5 % IJ SOLN
INTRAMUSCULAR | Status: DC | PRN
  Administered 2012-04-25: 10 mL

## 2012-04-25 MED ORDER — ONDANSETRON HCL 4 MG/2ML IJ SOLN
INTRAMUSCULAR | Status: AC
  Filled 2012-04-25: qty 2

## 2012-04-25 MED ORDER — MIDAZOLAM HCL 2 MG/2ML IJ SOLN: 0.5000 mg | Freq: Once | INTRAMUSCULAR | Status: AC | PRN

## 2012-04-25 MED ORDER — LIDOCAINE HCL (CARDIAC) 20 MG/ML IV SOLN
INTRAVENOUS | Status: AC
  Filled 2012-04-25: qty 5

## 2012-04-25 MED ORDER — LACTATED RINGERS IV SOLN
INTRAVENOUS | Status: DC
  Administered 2012-04-25 (×2): via INTRAVENOUS

## 2012-04-25 MED ORDER — BUPIVACAINE HCL (PF) 0.5 % IJ SOLN
INTRAMUSCULAR | Status: AC
  Filled 2012-04-25: qty 30

## 2012-04-25 MED ORDER — FENTANYL CITRATE 0.05 MG/ML IJ SOLN: 25.0000 ug | INTRAMUSCULAR | Status: DC | PRN

## 2012-04-25 MED ORDER — BUPIVACAINE IN DEXTROSE 0.75-8.25 % IT SOLN
INTRATHECAL | Status: DC | PRN
  Administered 2012-04-25: 11 mg via INTRATHECAL

## 2012-04-25 MED ORDER — PROMETHAZINE HCL 25 MG/ML IJ SOLN: 6.2500 mg | INTRAMUSCULAR | Status: DC | PRN

## 2012-04-25 SURGICAL SUPPLY — 23 items
ADH SKN CLS APL DERMABOND .7 (GAUZE/BANDAGES/DRESSINGS) ×2
CHLORAPREP W/TINT 26ML (MISCELLANEOUS) ×1 IMPLANT
CLIP FILSHIE TUBAL LIGA STRL (Clip) ×2 IMPLANT
CLOTH BEACON ORANGE TIMEOUT ST (SAFETY) ×2 IMPLANT
DERMABOND ADVANCED (GAUZE/BANDAGES/DRESSINGS) ×2
DERMABOND ADVANCED .7 DNX12 (GAUZE/BANDAGES/DRESSINGS) ×2 IMPLANT
ELECT REM PT RETURN 9FT ADLT (ELECTROSURGICAL) ×2
ELECTRODE REM PT RTRN 9FT ADLT (ELECTROSURGICAL) IMPLANT
GLOVE BIO SURGEON STRL SZ 6.5 (GLOVE) ×2 IMPLANT
GLOVE BIOGEL PI IND STRL 7.0 (GLOVE) ×2 IMPLANT
GLOVE BIOGEL PI INDICATOR 7.0 (GLOVE) ×2
GLOVE NEODERM STER SZ 7 (GLOVE) ×4 IMPLANT
GOWN STRL REIN XL XLG (GOWN DISPOSABLE) ×3 IMPLANT
PACK ABDOMINAL MINOR (CUSTOM PROCEDURE TRAY) ×1 IMPLANT
PAD OB MATERNITY 4.3X12.25 (PERSONAL CARE ITEMS) ×1 IMPLANT
PROTECTOR NERVE ULNAR (MISCELLANEOUS) ×2 IMPLANT
SUT VIC AB 0 CT1 27 (SUTURE) ×2
SUT VIC AB 0 CT1 27XBRD ANBCTR (SUTURE) IMPLANT
SUT VIC AB 4-0 PS2 27 (SUTURE) ×2 IMPLANT
SYR CONTROL 10ML LL (SYRINGE) ×1 IMPLANT
TOWEL OR 17X24 6PK STRL BLUE (TOWEL DISPOSABLE) ×2 IMPLANT
TRAY FOLEY BAG SILVER LF 14FR (CATHETERS) ×1 IMPLANT
TRAY FOLEY CATH 14FR (SET/KITS/TRAYS/PACK) ×2 IMPLANT

## 2012-04-25 NOTE — Progress Notes (Signed)
Post Partum Day 1 Subjective: no complaints, up ad lib, voiding and tolerating PO, small lochia, plans to bottle feed, NPO for BTL today at 1300  Objective: Blood pressure 107/72, pulse 99, temperature 98.1 F (36.7 C), temperature source Oral, resp. rate 18, height 5' (1.524 m), weight 57.153 kg (126 lb), last menstrual period 08/02/2011, SpO2 100.00%, unknown if currently breastfeeding.  Physical Exam:  General: alert, cooperative and no distress Lochia:normal flow Chest: CTAB Heart: RRR no m/r/g Abdomen: +BS, soft, nontender,  Uterine Fundus: firm DVT Evaluation: No evidence of DVT seen on physical exam. Extremities: no edema   Basename 04/24/12 0750  HGB 11.5*  HCT 33.6*    Assessment/Plan: Plan for discharge tomorrow   LOS: 1 day   CRESENZO-DISHMAN,Odies Desa 04/25/2012, 7:33 AM

## 2012-04-25 NOTE — Anesthesia Postprocedure Evaluation (Signed)
Anesthesia Post Note  Patient: Melissa [name]  Procedure(s) Performed: Procedure(s) (LRB): POST PARTUM TUBAL LIGATION (Bilateral)  Anesthesia type: Spinal  Patient location: PACU  Post pain: Pain level controlled  Post assessment: Post-op Vital signs reviewed  Last Vitals:  Filed Vitals:   04/25/12 1415  BP: 91/44  Pulse: 68  Temp:   Resp: 16    Post vital signs: Reviewed  Level of consciousness: awake  Complications: No apparent anesthesia complications

## 2012-04-25 NOTE — Progress Notes (Signed)
Patient ID: Melissa [name], female   DOB: Oct 06, 1970, 41 y.o.   MRN: [id number] Pt s/p SVD yesterday.  Desires sterilization.  Reviewed risk and alternatives.  All questions answered to pts and partners satisfaction. Consent signed. Pt looked excessively drowsy. Will order urine drug screen.  Lyric Rossano L. Harraway-Smith, M.D., Evern Core

## 2012-04-25 NOTE — Op Note (Signed)
Melissa [name] 04/24/2012 - 04/25/2012  PREOPERATIVE DIAGNOSIS:  Multiparity, undesired fertility  POSTOPERATIVE DIAGNOSIS:  Multiparity, undesired fertility  PROCEDURE:  Postpartum Bilateral Tubal Sterilization using Filshie Clips   ANESTHESIA:  Epidural and local analgesia using 0.5% Marcaine  COMPLICATIONS:  None immediate.  ESTIMATED BLOOD LOSS: 5 ml.  FLUIDS: 1100 ml LR.  URINE OUTPUT:  20 ml of clear urine.  INDICATIONS: 41 y.o. [id number]  with undesired fertility,status post vaginal delivery, desires permanent sterilization.  Other reversible forms of contraception were discussed with patient; she declines all other modalities. Risks of procedure discussed with patient including but not limited to: risk of regret, permanence of method, bleeding, infection, injury to surrounding organs and need for additional procedures.  Failure risk of 0.5-1% with increased risk of ectopic gestation if pregnancy occurs was also discussed with patient.     FINDINGS:  Normal uterus, tubes, and ovaries.  PROCEDURE DETAILS: The patient was taken to the operating room where her epidural anesthesia was dosed up to surgical level and found to be adequate.  She was then placed in the dorsal supine position and prepped and draped in sterile fashion.  After an adequate timeout was performed, attention was turned to the patient's abdomen where a small transverse skin incision was made under the umbilical fold. The incision was taken down to the layer of fascia using the scalpel, and fascia was incised, and extended bilaterally using Mayo scissors. The peritoneum was entered in a sharp fashion. Attention was then turned to the patient's uterus, and left fallopian tube was identified and followed out to the fimbriated end.  A Filshie clip was placed on the left fallopian tube about 3 cm from the cornual attachment, with care given to incorporate the underlying mesosalpinx.  A similar process was carried out on the  right side allowing for bilateral tubal sterilization.  Good hemostasis was noted overall.  The instruments were then removed from the patient's abdomen and the fascial incision was repaired with 0 Vicryl, and the skin was closed with a 4-0 Vicryl subcuticular stitch. The patient tolerated the procedure well.  Instrument, sponge, and needle counts were correct times two.  The patient was then taken to the recovery room awake and in stable condition.

## 2012-04-25 NOTE — Clinical Social Work Maternal (Signed)
    Clinical Social Work Department PSYCHOSOCIAL ASSESSMENT - MATERNAL/CHILD 04/25/2012  Patient:  Melissa [name], Melissa [name]  Account Number:  [id number]  Admit Date:  04/24/2012  Marjo Bicker Name:   Lucas Mallow. Overby    Clinical Social Worker:  Nobie Putnam, Theresia Majors   Date/Time:  04/25/2012 01:19 PM  Date Referred:  04/25/2012   Referral source  CN     Referred reason  Substance Abuse   Other referral source:    I:  FAMILY / HOME ENVIRONMENT Child's legal guardian:  PARENT  Guardian - Name Guardian - Age Guardian - Address  Melissa [name] [address] [place], [place] [address]  Melissa [name] 40 (same as above)   Other household support members/support persons Other support:   FOB's family    II  PSYCHOSOCIAL DATA Information Source:  Family Interview  Surveyor, quantity and [employer] Employment:   Surveyor, quantity resources:  [employer] If Medicaid - County:  [employer] Other  Sales executive  WIC   School / Grade:   Maternity Care Coordinator / Child Services Coordination / Early Interventions:  Cultural issues impacting care:    III  STRENGTHS Strengths  Adequate Resources  Home prepared for Child (including basic supplies)  Supportive family/friends   Strength comment:    IV  RISK FACTORS AND CURRENT PROBLEMS Current Problem:  YES   Risk Factor & Current Problem Patient Issue Family Issue Risk Factor / Current Problem Comment  Substance Abuse Y N Hx of MJ use    V  SOCIAL WORK ASSESSMENT Sw referral received to assess pt's history of MJ use. Pt denies any MJ use since 2005 or 2007.  She also denies any Etoh or other illegal substance use during the pregnancy. UDS is negative, meconium results are pending.  Pt is currently living with the FOB.  She has 3 other children, of whom she does not have custody.  According to the pt, their parental rights were terminated in 2005 and her children were adopted by different families.  [facility], CPS was involved at that time  and facilitated the termination of parental rights.  FOB explained that they were not financially stable the meet the needs of the children at that time.  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  FOB is in the process of applying for disability.  Sw explained that a CPS report would be made since their rights were terminated on the other children.  The parents verbalized understanding and do not appear to concerned about CPS involvement, since their situation has improved.  They have all the necessary supplies for the infant but expressed additional need for formula.  Sw will report case to CPS and continue to monitor drug screen results.  Sw available to assist further if needed.      VI SOCIAL WORK PLAN Social Work Plan  No Further Intervention Required / No Barriers to Discharge   Type of pt/family education:   If child protective services report - county:  [place] If child protective services report - date:  04/25/2012 Information/referral to community resources comment:   Other social work plan:

## 2012-04-25 NOTE — Anesthesia Procedure Notes (Signed)
Spinal  Patient location during procedure: OR Start time: 04/25/2012 1:22 PM Staffing Anesthesiologist: Brayton Caves R Performed by: anesthesiologist  Preanesthetic Checklist Completed: patient identified, site marked, surgical consent, pre-op evaluation, timeout performed, IV checked, risks and benefits discussed and monitors and equipment checked Spinal Block Patient position: sitting Prep: DuraPrep Patient monitoring: heart rate, cardiac monitor, continuous pulse ox and blood pressure Approach: midline Location: L3-4 Injection technique: single-shot Needle Needle type: Sprotte  Needle gauge: 24 G Needle length: 9 cm Assessment Sensory level: T4 Additional Notes Patient identified.  Risk benefits discussed including failed block, incomplete pain control, headache, nerve damage, paralysis, blood pressure changes, nausea, vomiting, reactions to medication both toxic or allergic, and postpartum back pain.  Patient expressed understanding and wished to proceed.  All questions were answered.  Sterile technique used throughout procedure.  CSF was clear.  No parasthesia or other complications.  Please see nursing notes for vital signs.

## 2012-04-25 NOTE — Anesthesia Preprocedure Evaluation (Signed)
Anesthesia Evaluation  Patient identified by MRN, date of birth, ID band Patient awake    Reviewed: Allergy & Precautions, H&P , Patient's Chart, lab work & pertinent test results  Airway Mallampati: II      Dental No notable dental hx.    Pulmonary neg pulmonary ROS,  breath sounds clear to auscultation  Pulmonary exam normal       Cardiovascular Exercise Tolerance: Good negative cardio ROS  Rhythm:regular Rate:Normal     Neuro/Psych negative neurological ROS  negative psych ROS   GI/Hepatic negative GI ROS, Neg liver ROS,   Endo/Other  negative endocrine ROS  Renal/GU negative Renal ROS  negative genitourinary   Musculoskeletal   Abdominal Normal abdominal exam  (+)   Peds  Hematology negative hematology ROS (+)   Anesthesia Other Findings   Reproductive/Obstetrics negative OB ROS                           Anesthesia Physical Anesthesia Plan  ASA: II  Anesthesia Plan: Spinal   Post-op Pain Management:    Induction:   Airway Management Planned:   Additional Equipment:   Intra-op Plan:   Post-operative Plan:   Informed Consent: I have reviewed the patients History and Physical, chart, labs and discussed the procedure including the risks, benefits and alternatives for the proposed anesthesia with the patient or authorized representative who has indicated his/her understanding and acceptance.     Plan Discussed with: Anesthesiologist, CRNA and Surgeon  Anesthesia Plan Comments:         Anesthesia Quick Evaluation  

## 2012-04-25 NOTE — Progress Notes (Signed)
UR chart review completed.  

## 2012-04-25 NOTE — Transfer of Care (Signed)
Immediate Anesthesia Transfer of Care Note  Patient: Melissa [name]  Procedure(s) Performed: Procedure(s) (LRB) with comments: POST PARTUM TUBAL LIGATION (Bilateral)  Patient Location: PACU  Anesthesia Type: Spinal  Level of Consciousness: awake  Airway & Oxygen Therapy: Patient Spontanous Breathing  Post-op Assessment: Report given to PACU RN and Post -op Vital signs reviewed and stable  Post vital signs: stable  Complications: No apparent anesthesia complications

## 2012-04-26 ENCOUNTER — Encounter (HOSPITAL_COMMUNITY): Payer: Self-pay | Admitting: Obstetrics & Gynecology

## 2012-04-26 DIAGNOSIS — Z9851 Tubal ligation status: Secondary | ICD-10-CM

## 2012-04-26 MED ORDER — OXYCODONE-ACETAMINOPHEN 5-325 MG PO TABS: 1.0000 | ORAL_TABLET | Freq: Four times a day (QID) | ORAL | Status: DC | PRN

## 2012-04-26 MED ORDER — SENNOSIDES-DOCUSATE SODIUM 8.6-50 MG PO TABS: 2.0000 | ORAL_TABLET | Freq: Every day | ORAL | Status: DC

## 2012-04-26 MED ORDER — OXYCODONE-ACETAMINOPHEN 5-325 MG PO TABS: 1.0000 | ORAL_TABLET | Freq: Four times a day (QID) | ORAL | Status: AC | PRN

## 2012-04-26 NOTE — Progress Notes (Signed)
Interim progress note:  Pt with positive UDS for benzodiazepines 04/25/12 at 13:10, with no documented benzodiazepine administration in hospital.  Anesthesiology contacted and pharmacy contacted; pharmacy checked pyxis records which show versed 2mg  taken out but then all 2mg  wasted and not given to pt.    Social work notified and to see patient prior to discharge.  Simone Curia 04/26/2012 9:52 AM

## 2012-04-26 NOTE — Discharge Summary (Signed)
Obstetric Discharge Summary  Postpartum day 2  Reason for Admission: induction of labor Prenatal Procedures: none Intrapartum Procedures: spontaneous vaginal delivery and GBS prophylaxis Postpartum Procedures: P.P. tubal ligation Complications-Operative and Postpartum: none Hemoglobin  Date Value Range Status  04/24/2012 11.5* 12.0 - 15.0 g/dL Final     HCT  Date Value Range Status  04/24/2012 33.6* 36.0 - 46.0 % Final    Physical Exam:  General: alert, cooperative, appears stated age and no distress CV: RRR PULM: CTAB, nl effort ABD: NABS Lochia: appropriate Uterine Fundus: firm Incision: from BTL; healing well DVT Evaluation: No evidence of DVT seen on physical exam. No cords or calf tenderness. No significant calf/ankle edema.  Discharge Diagnoses: Term Pregnancy-delivered  Discharge Information: Date: 04/26/2012 Activity: pelvic rest Diet: routine Medications: PNV, Colace and Percocet Condition: stable Instructions: refer to practice specific booklet Discharge to: home Given a positive UDS for benzos and no noted benzo dosage, CSW referral made prior to discharge  Newborn Data: Live born female  Birth Weight: 6 lb 3.3 oz (2815 g) APGAR: 9, 9  Home with mother though pending CSW involvement for positive UDS for benzos.  Simone Curia 04/26/2012, 9:24 AM  I have seen this patient and agree with the above resident's note.  LEFTWICH-KIRBY, Juliannah Ohmann Certified Nurse-Midwife

## 2012-04-26 NOTE — Progress Notes (Signed)
[place] CPS has decided to petition for custody of the infant. Once petition filed, she will bring a copy for hospital Sw and the parents. Sw informed the parents of the decision. Pt was tearful. Sw will continue to follow and facilitate appropriate discharge to Jacqueline Strand, CPS worker.      

## 2012-04-26 NOTE — Progress Notes (Signed)
Sw spoke with the pt about the positive UDS for benzodiazepines. Pt told Sw that the FOB has a prescription of Xanax 2mg, he takes 3 times a day. FOB explained that he uses a pill crusher for the Xanax because he can't swallow pills and it "hits faster" when crushed. FOB took the crusher of his pocket and showed to this Sw. Pt told Sw that she was prescribed Tylenol 3 during the pregnancy for headaches and took "just one" yesterday, while here in the hospital. According to the pt, she used the pill crusher and "it must have had a trace of the Xanax." Sw told pt that she did not test positive for opiates, which she admits to taking, rather benzodiazepines. Pt did not have an explanation and denies taking the Xanax regularly during the pregnancy. She took the Tylenol 3, BID during the last 2 months of pregnancy. Sw told the parents that the UDS results would be reported to CPS worker. CPS case is assigned to Jacquline Strand. This Sw is waiting to hear from CPS worker about discharge.      

## 2013-02-02 ENCOUNTER — Emergency Department (HOSPITAL_COMMUNITY)
Admission: EM | Admit: 2013-02-02 | Discharge: 2013-02-02 | Disposition: A | Discharge status: Home / Self Care | Payer: Self-pay | Attending: Emergency Medicine | Admitting: Emergency Medicine

## 2013-02-02 ENCOUNTER — Encounter (HOSPITAL_COMMUNITY): Payer: Self-pay | Admitting: *Deleted

## 2013-02-02 DIAGNOSIS — Z9851 Tubal ligation status: Secondary | ICD-10-CM | POA: Insufficient documentation

## 2013-02-02 DIAGNOSIS — T6391XA Toxic effect of contact with unspecified venomous animal, accidental (unintentional), initial encounter: Secondary | ICD-10-CM | POA: Insufficient documentation

## 2013-02-02 DIAGNOSIS — R21 Rash and other nonspecific skin eruption: Secondary | ICD-10-CM | POA: Insufficient documentation

## 2013-02-02 DIAGNOSIS — J309 Allergic rhinitis, unspecified: Secondary | ICD-10-CM | POA: Insufficient documentation

## 2013-02-02 DIAGNOSIS — Y9389 Activity, other specified: Secondary | ICD-10-CM | POA: Insufficient documentation

## 2013-02-02 DIAGNOSIS — Z79899 Other long term (current) drug therapy: Secondary | ICD-10-CM | POA: Insufficient documentation

## 2013-02-02 DIAGNOSIS — F17200 Nicotine dependence, unspecified, uncomplicated: Secondary | ICD-10-CM | POA: Insufficient documentation

## 2013-02-02 DIAGNOSIS — T63461A Toxic effect of venom of wasps, accidental (unintentional), initial encounter: Secondary | ICD-10-CM | POA: Insufficient documentation

## 2013-02-02 DIAGNOSIS — Y9289 Other specified places as the place of occurrence of the external cause: Secondary | ICD-10-CM | POA: Insufficient documentation

## 2013-02-02 DIAGNOSIS — M549 Dorsalgia, unspecified: Secondary | ICD-10-CM | POA: Insufficient documentation

## 2013-02-02 DIAGNOSIS — Z9889 Other specified postprocedural states: Secondary | ICD-10-CM | POA: Insufficient documentation

## 2013-02-02 HISTORY — DX: Dorsalgia, unspecified: M54.9

## 2013-02-02 MED ORDER — DIPHENHYDRAMINE HCL 25 MG PO CAPS
25.0000 mg | ORAL_CAPSULE | Freq: Once | ORAL | Status: AC
  Administered 2013-02-02: 25 mg via ORAL
  Filled 2013-02-02: qty 1

## 2013-02-02 MED ORDER — HYDROCODONE-ACETAMINOPHEN 5-325 MG PO TABS: 1.0000 | ORAL_TABLET | Freq: Four times a day (QID) | ORAL | Status: DC | PRN

## 2013-02-02 MED ORDER — OXYCODONE-ACETAMINOPHEN 5-325 MG PO TABS
1.0000 | ORAL_TABLET | Freq: Once | ORAL | Status: AC
  Administered 2013-02-02: 1 via ORAL
  Filled 2013-02-02: qty 1

## 2013-02-02 MED ORDER — HYDROMORPHONE HCL PF 1 MG/ML IJ SOLN
1.0000 mg | Freq: Once | INTRAMUSCULAR | Status: AC
  Administered 2013-02-02: 1 mg via INTRAMUSCULAR
  Filled 2013-02-02: qty 1

## 2013-02-02 NOTE — ED Provider Notes (Signed)
History    This chart was scribed for Benny Lennert, MD by Melba Coon, ED Scribe. The patient was seen in room APA01/APA01 and the patient's care was started at 12:08PM.    CSN: [id number]  Arrival date & time 02/02/13  1125   None     Chief Complaint  Patient presents with  . Insect Bite    (Consider location/radiation/quality/duration/timing/severity/associated sxs/prior treatment) Patient is a 42 y.o. female presenting with animal bite.  Animal Bite Contact animal:  Insect (bee) Location:  Torso (right side) Torso injury location:  R flank Time since incident:  2 hours Associated symptoms: no rash    HPI Comments: Melissa [name] is a 42 y.o. female who presents to the Emergency Department complaining of an inset bite to the right torsollower flank with an onset 2 hours ago. Pt is allergic to bee venom. She was stung by a bee or a wasp today around onset and now pain is radiating to the right back and right lower abdomen. She has not treated this at home PTA. Allergic to ibuprofen. No other pertinent medical symptoms.  Past Medical History  Diagnosis Date  . Migraine   . AMA (advanced maternal age) multigravida 35+   . Back pain     Past Surgical History  Procedure Laterality Date  . Abdominal lesion    . Tubal ligation  04/25/2012    Procedure: POST PARTUM TUBAL LIGATION;  Surgeon: Willodean Rosenthal, MD;  Location: [facility];  Service: Gynecology;  Laterality: Bilateral;    Family History  Problem Relation Age of Onset  . Heart attack Father   . Multiple sclerosis Maternal Uncle     History  Substance Use Topics  . Smoking status: Current Every Day Smoker -- 0.25 packs/day    Types: Cigarettes  . Smokeless tobacco: Never Used  . Alcohol Use: Yes     Comment: OCc    OB History   Grav Para Term Preterm Abortions TAB SAB Ect Mult Living   4 4 4       4       Review of Systems  Constitutional: Negative for appetite change and fatigue.  HENT:  Negative for congestion, sinus pressure and ear discharge.   Eyes: Negative for discharge.  Respiratory: Negative for cough.   Cardiovascular: Negative for chest pain.  Gastrointestinal: Positive for abdominal pain. Negative for diarrhea.  Genitourinary: Positive for flank pain. Negative for frequency and hematuria.  Musculoskeletal: Positive for back pain.  Skin: Negative for rash.  Allergic/Immunologic: Positive for environmental allergies (allergic to bees).  Neurological: Negative for seizures and headaches.  Psychiatric/Behavioral: Negative for hallucinations.  All other systems reviewed and are negative.    Allergies  Bee venom; Ibuprofen; and Nutritional supplements  Home Medications   No current outpatient prescriptions on file.  BP 134/99  Pulse 82  Temp(Src) 98.8 F (37.1 C) (Oral)  Resp 22  Ht 5' (1.524 m)  Wt 118 lb (53.524 kg)  BMI 23.05 kg/m2  SpO2 100%  LMP 12/22/2012  Physical Exam  Nursing note and vitals reviewed. Constitutional: She is oriented to person, place, and time. She appears well-developed.  HENT:  Head: Normocephalic.  Eyes: Conjunctivae and EOM are normal. No scleral icterus.  Neck: Neck supple. No thyromegaly present.  Cardiovascular: Normal rate and regular rhythm.  Exam reveals no gallop and no friction rub.   No murmur heard. Pulmonary/Chest: Effort normal. No stridor. She has no wheezes. She has no rales. She exhibits no tenderness.  Abdominal: Soft. She exhibits no distension. There is tenderness. There is no rebound.  RUQ tenderness over a small local erythematous rash  Musculoskeletal: Normal range of motion. She exhibits no edema.  Lymphadenopathy:    She has no cervical adenopathy.  Neurological: She is alert and oriented to person, place, and time. Coordination normal.  Skin: Skin is warm. Rash noted. No erythema.  Psychiatric: She has a normal mood and affect. Her behavior is normal.   ED Course  Procedures (including  critical care time)  COORDINATION OF CARE:  12:10PM - percocet and benadryl will be ordered for Melissa [name].    Labs Reviewed - No data to display No results found.   No diagnosis found.    MDM    The chart was scribed for me under my direct supervision.  I personally performed the history, physical, and medical decision making and all procedures in the evaluation of this patient.Benny Lennert, MD 02/02/13 [phone]

## 2013-02-02 NOTE — ED Notes (Signed)
Pt with bee sting 2 1/2 hrs ago to right side, states pain radiates around front of abdomen and lower back

## 2013-02-02 NOTE — ED Notes (Signed)
Bee sting to right  side x 2 hrs ago with pain radiating to right side of back and RLQ.

## 2014-06-24 ENCOUNTER — Encounter (HOSPITAL_COMMUNITY): Payer: Self-pay | Admitting: *Deleted

## 2016-07-30 ENCOUNTER — Emergency Department (HOSPITAL_COMMUNITY)
Admission: EM | Admit: 2016-07-30 | Discharge: 2016-07-30 | Disposition: A | Discharge status: Home / Self Care | Payer: Self-pay | Attending: Emergency Medicine | Admitting: Emergency Medicine

## 2016-07-30 ENCOUNTER — Emergency Department (HOSPITAL_COMMUNITY)
Admission: EM | Admit: 2016-07-30 | Discharge: 2016-07-31 | Disposition: A | Discharge status: Home / Self Care | Payer: Medicaid Other | Attending: Emergency Medicine | Admitting: Emergency Medicine

## 2016-07-30 ENCOUNTER — Emergency Department (HOSPITAL_COMMUNITY): Payer: Self-pay

## 2016-07-30 ENCOUNTER — Encounter (HOSPITAL_COMMUNITY): Payer: Self-pay | Admitting: Emergency Medicine

## 2016-07-30 DIAGNOSIS — S20229A Contusion of unspecified back wall of thorax, initial encounter: Secondary | ICD-10-CM | POA: Insufficient documentation

## 2016-07-30 DIAGNOSIS — Y929 Unspecified place or not applicable: Secondary | ICD-10-CM | POA: Insufficient documentation

## 2016-07-30 DIAGNOSIS — Y939 Activity, unspecified: Secondary | ICD-10-CM | POA: Insufficient documentation

## 2016-07-30 DIAGNOSIS — F17210 Nicotine dependence, cigarettes, uncomplicated: Secondary | ICD-10-CM | POA: Insufficient documentation

## 2016-07-30 DIAGNOSIS — Y999 Unspecified external cause status: Secondary | ICD-10-CM | POA: Insufficient documentation

## 2016-07-30 DIAGNOSIS — S20229D Contusion of unspecified back wall of thorax, subsequent encounter: Secondary | ICD-10-CM

## 2016-07-30 DIAGNOSIS — W000XXA Fall on same level due to ice and snow, initial encounter: Secondary | ICD-10-CM | POA: Insufficient documentation

## 2016-07-30 DIAGNOSIS — S39012A Strain of muscle, fascia and tendon of lower back, initial encounter: Secondary | ICD-10-CM | POA: Insufficient documentation

## 2016-07-30 DIAGNOSIS — W0110XA Fall on same level from slipping, tripping and stumbling with subsequent striking against unspecified object, initial encounter: Secondary | ICD-10-CM | POA: Insufficient documentation

## 2016-07-30 MED ORDER — ONDANSETRON 8 MG PO TBDP
8.0000 mg | ORAL_TABLET | Freq: Once | ORAL | Status: AC
  Administered 2016-07-30: 8 mg via ORAL
  Filled 2016-07-30: qty 1

## 2016-07-30 MED ORDER — HYDROMORPHONE HCL 2 MG/ML IJ SOLN
2.0000 mg | Freq: Once | INTRAMUSCULAR | Status: AC
  Administered 2016-07-30: 2 mg via INTRAMUSCULAR
  Filled 2016-07-30: qty 1

## 2016-07-30 MED ORDER — TRAMADOL HCL 50 MG PO TABS
50.0000 mg | ORAL_TABLET | Freq: Once | ORAL | Status: AC
  Administered 2016-07-30: 50 mg via ORAL
  Filled 2016-07-30: qty 1

## 2016-07-30 MED ORDER — OXYCODONE-ACETAMINOPHEN 5-325 MG PO TABS: 1.0000 | ORAL_TABLET | ORAL | 0 refills | Status: AC | PRN

## 2016-07-30 MED ORDER — TRAMADOL HCL 50 MG PO TABS: 50.0000 mg | ORAL_TABLET | Freq: Four times a day (QID) | ORAL | 0 refills | Status: AC | PRN

## 2016-07-30 NOTE — ED Triage Notes (Signed)
Pt reports she slipped and fell injuring her back this am.

## 2016-07-30 NOTE — Discharge Instructions (Signed)
See your Physicain for recheck in 3-4 days if not improving

## 2016-07-30 NOTE — ED Triage Notes (Signed)
Pt returns stating the tramadol she was given "is not working."

## 2016-07-30 NOTE — ED Provider Notes (Signed)
[facility] Provider Note   CSN: [id number] Arrival date & time: 07/30/16  2206     History   Chief Complaint Chief Complaint  Patient presents with  . Back Pain    HPI Melissa [name] is a 45 y.o. female.  She was here earlier today and having injured her back when she slipped on ice. She is complaining of pain in her midthoracic area which is now radiating up towards her neck. She rates pain at 10/10. She had an x-ray done when she was here earlier showing no evidence of fracture and was sent home with prescription for tramadol. Earlier in the day, she had taken 3 acetaminophen tablets with no relief and then followed with 2 more acetaminophen tablets. Apparently, there had been concerned with not giving her additional acetaminophen. She denies weakness or numbness.   The history is provided by the patient.  Back Pain      Past Medical History:  Diagnosis Date  . AMA (advanced maternal age) multigravida 35+   . Back pain   . Migraine     Patient Active Problem List   Diagnosis Date Noted  . Normal vaginal delivery 04/26/2012  . S/P tubal ligation 04/26/2012    Past Surgical History:  Procedure Laterality Date  . abdominal lesion    . TUBAL LIGATION  04/25/2012   Procedure: POST PARTUM TUBAL LIGATION;  Surgeon: Willodean Rosenthalarolyn Harraway-Smith, MD;  Location: [facility];  Service: Gynecology;  Laterality: Bilateral;    OB History    Gravida Para Term Preterm AB Living   4 4 4     4    SAB TAB Ectopic Multiple Live Births           4       Home Medications    Prior to Admission medications   Medication Sig Start Date End Date Taking? Authorizing Provider  traMADol (ULTRAM) 50 MG tablet Take 1 tablet (50 mg total) by mouth every 6 (six) hours as needed. 07/30/16  Yes Elson AreasLeslie K Sofia, PA-C    Family History Family History  Problem Relation Age of Onset  . Heart attack Father   . Multiple sclerosis Maternal Uncle     Social History Social History    Substance Use Topics  . Smoking status: Current Every Day Smoker    Packs/day: 0.25    Types: Cigarettes  . Smokeless tobacco: Never Used  . Alcohol use No     Allergies   Bee venom; Ibuprofen; and Nutritional supplements   Review of Systems Review of Systems  Musculoskeletal: Positive for back pain.  All other systems reviewed and are negative.    Physical Exam Updated Vital Signs BP 139/74 (BP Location: Left Arm)   Pulse 67   Temp 97.9 F (36.6 C) (Oral)   Resp 16   Ht 5' (1.524 m)   Wt 105 lb (47.6 kg)   LMP 07/07/2016   SpO2 100%   BMI 20.51 kg/m   Physical Exam  Nursing note and vitals reviewed.  45 year old female, Appears to be in significant pain, but his in no acute distress. Vital signs are normal. Oxygen saturation is 100%, which is normal. Head is normocephalic and atraumatic. PERRLA, EOMI. Oropharynx is clear. Neck is nontender and supple without adenopathy or JVD. Back is moderately tender in the mid thoracic area with maximum tenderness at approximately the T8 level. There is no CVA tenderness. Lungs are clear without rales, wheezes, or rhonchi. Chest is nontender. Heart has  regular rate and rhythm without murmur. Abdomen is soft, flat, nontender without masses or hepatosplenomegaly and peristalsis is normoactive. Extremities have no cyanosis or edema, full range of motion is present. Skin is warm and dry without rash. Neurologic: Mental status is normal, cranial nerves are intact, there are no motor or sensory deficits.  ED Treatments / Results   Radiology Dg Thoracic Spine 2 View  Result Date: 07/30/2016 CLINICAL DATA:  45 y/o F; 45 y/o F; status post fall with upper back pain radiating to the shoulders. Initial encounter. EXAM: THORACIC SPINE 2 VIEWS COMPARISON:  None. FINDINGS: No acute fracture or dislocation of the thoracic spine. Vertebral body and disc space heights are maintained. Eleven paired full ribs with diminutive T12 ribs.  Levocurvature of thoracolumbar junction. IMPRESSION: Negative. Electronically Signed   By: Mitzi HansenLance  Furusawa-Stratton M.D.   On: 07/30/2016 18:35    Procedures Procedures (including critical care time)  Medications Ordered in ED Medications  HYDROmorphone (DILAUDID) injection 2 mg (not administered)  ondansetron (ZOFRAN-ODT) disintegrating tablet 8 mg (not administered)     Initial Impression / Assessment and Plan / ED Course  I have reviewed the triage vital signs and the nursing notes.  Pertinent imaging results that were available during my care of the patient were reviewed by me and considered in my medical decision making (see chart for details).  Clinical Course    Mid back pain following fall. Old records are reviewed confirming ED visit earlier today with thoracic spine x-ray showing no evidence of fracture. No relief with tramadol. [place] substance [place] website was reviewed and she has no narcotic prescriptions in the last 6 months. She's given injection of hydromorphone and will be observed for pain relief.  She had good relief of pain with above noted treatment. She is discharged with prescription for oxycodone have acetaminophen. Follow-up with PCP in 3 days.  Final Clinical Impressions(s) / ED Diagnoses   Final diagnoses:  Contusion of mid back, subsequent encounter    New Prescriptions New Prescriptions   No medications on file     Dione Boozeavid Octavius Shin, MD 07/31/16 0003

## 2016-07-30 NOTE — ED Provider Notes (Signed)
[facility] Provider Note   CSN: [id number] Arrival date & time: 07/30/16  1727     History   Chief Complaint Chief Complaint  Patient presents with  . Fall    HPI Melissa [name] is a 45 y.o. female. Pt reports she slipped in snow and fell hitting her low back The history is provided by the patient. No language interpreter was used.  Fall  This is a new problem. The current episode started 6 to 12 hours ago. The problem occurs constantly. The problem has been gradually worsening. Nothing aggravates the symptoms. Nothing relieves the symptoms. She has tried acetaminophen for the symptoms. The treatment provided no relief.  Pt took hydrocodone with no relief.  Pt has been taking tylenol as well.    Past Medical History:  Diagnosis Date  . AMA (advanced maternal age) multigravida 35+   . Back pain   . Migraine     Patient Active Problem List   Diagnosis Date Noted  . Normal vaginal delivery 04/26/2012  . S/P tubal ligation 04/26/2012    Past Surgical History:  Procedure Laterality Date  . abdominal lesion    . TUBAL LIGATION  04/25/2012   Procedure: POST PARTUM TUBAL LIGATION;  Surgeon: Willodean Rosenthalarolyn Harraway-Smith, MD;  Location: [facility];  Service: Gynecology;  Laterality: Bilateral;    OB History    Gravida Para Term Preterm AB Living   4 4 4     4    SAB TAB Ectopic Multiple Live Births           4       Home Medications    Prior to Admission medications   Medication Sig Start Date End Date Taking? Authorizing Provider  HYDROcodone-acetaminophen (NORCO/VICODIN) 5-325 MG per tablet Take 1 tablet by mouth every 6 (six) hours as needed for pain. 02/02/13   Bethann BerkshireJoseph Zammit, MD  traMADol (ULTRAM) 50 MG tablet Take 1 tablet (50 mg total) by mouth every 6 (six) hours as needed. 07/30/16   Elson AreasLeslie K Rozina Pointer, PA-C    Family History Family History  Problem Relation Age of Onset  . Heart attack Father   . Multiple sclerosis Maternal Uncle     Social  History Social History  Substance Use Topics  . Smoking status: Current Every Day Smoker    Packs/day: 0.25    Types: Cigarettes  . Smokeless tobacco: Never Used  . Alcohol use No     Allergies   Bee venom; Ibuprofen; and Nutritional supplements   Review of Systems Review of Systems  All other systems reviewed and are negative.    Physical Exam Updated Vital Signs BP 130/66 (BP Location: Left Arm)   Pulse 89   Temp 98.6 F (37 C) (Oral)   Resp 16   Ht 5' (1.524 m)   Wt 47.6 kg   LMP 07/07/2016   SpO2 100%   BMI 20.51 kg/m   Physical Exam  Constitutional: She is oriented to person, place, and time. She appears well-developed and well-nourished.  HENT:  Head: Normocephalic.  Eyes: EOM are normal.  Neck: Normal range of motion.  Cardiovascular: Normal rate and regular rhythm.   Pulmonary/Chest: Effort normal.  Abdominal: She exhibits no distension.  Musculoskeletal:  Tender mid thoracic spine,    Neurological: She is alert and oriented to person, place, and time.  Psychiatric: She has a normal mood and affect.  Nursing note and vitals reviewed.    ED Treatments / Results  Labs (all labs ordered  are listed, but only abnormal results are displayed) Labs Reviewed - No data to display  EKG  EKG Interpretation None       Radiology Dg Thoracic Spine 2 View  Result Date: 07/30/2016 CLINICAL DATA:  45 y/o F; 45 y/o F; status post fall with upper back pain radiating to the shoulders. Initial encounter. EXAM: THORACIC SPINE 2 VIEWS COMPARISON:  None. FINDINGS: No acute fracture or dislocation of the thoracic spine. Vertebral body and disc space heights are maintained. Eleven paired full ribs with diminutive T12 ribs. Levocurvature of thoracolumbar junction. IMPRESSION: Negative. Electronically Signed   By: Mitzi HansenLance  Furusawa-Stratton M.D.   On: 07/30/2016 18:35    Procedures Procedures (including critical care time)  Medications Ordered in ED Medications   traMADol (ULTRAM) tablet 50 mg (50 mg Oral Given 07/30/16 1849)     Initial Impression / Assessment and Plan / ED Course  I have reviewed the triage vital signs and the nursing notes.  Pertinent labs & imaging results that were available during my care of the patient were reviewed by me and considered in my medical decision making (see chart for details).  Clinical Course     Pt is allergic to ibuprofen.  She does not want to take anymore medications with tylenol.   Xray no fracture.  Final Clinical Impressions(s) / ED Diagnoses   Final diagnoses:  Contusion, back, unspecified laterality, initial encounter    New Prescriptions Discharge Medication List as of 07/30/2016  6:42 PM    START taking these medications   Details  traMADol (ULTRAM) 50 MG tablet Take 1 tablet (50 mg total) by mouth every 6 (six) hours as needed., Starting Fri 07/30/2016, Print      An After Visit Summary was printed and given to the patient.   Lonia SkinnerLeslie K Lake VillageSofia, PA-C 07/30/16 2105    Samuel JesterKathleen McManus, DO 07/31/16 2255

## 2016-07-31 NOTE — ED Notes (Signed)
Patient was given a prepackage of six Oxycodone/Acetaminophen and instructions on use.

## 2016-08-02 MED FILL — Oxycodone w/ Acetaminophen Tab 5-325 MG: ORAL | Qty: 6 | Status: AC
